# Patient Record
Sex: Female | Born: 1975 | Race: Black or African American | Hispanic: No | Marital: Married | Smoking: Never smoker
Health system: Southern US, Community
[De-identification: ages and names within clinical notes are randomized; demographics above are authoritative.]

## PROBLEM LIST (undated history)

## (undated) DIAGNOSIS — Z789 Other specified health status: Secondary | ICD-10-CM

## (undated) HISTORY — PX: NO PAST SURGERIES: SHX2092

---

## 2004-09-11 ENCOUNTER — Encounter: Admission: RE | Admit: 2004-09-11 | Discharge: 2004-09-11 | Payer: Self-pay | Admitting: Specialist

## 2004-11-03 ENCOUNTER — Emergency Department (HOSPITAL_COMMUNITY): Admission: EM | Admit: 2004-11-03 | Discharge: 2004-11-03 | Payer: Self-pay | Admitting: Family Medicine

## 2004-12-21 ENCOUNTER — Other Ambulatory Visit: Admission: RE | Admit: 2004-12-21 | Discharge: 2004-12-21 | Payer: Self-pay | Admitting: Family Medicine

## 2005-05-12 ENCOUNTER — Emergency Department (HOSPITAL_COMMUNITY): Admission: EM | Admit: 2005-05-12 | Discharge: 2005-05-13 | Payer: Self-pay | Admitting: Emergency Medicine

## 2007-10-11 ENCOUNTER — Emergency Department (HOSPITAL_COMMUNITY): Admission: EM | Admit: 2007-10-11 | Discharge: 2007-10-11 | Payer: Self-pay | Admitting: Emergency Medicine

## 2009-07-18 ENCOUNTER — Encounter: Admission: RE | Admit: 2009-07-18 | Discharge: 2009-07-18 | Payer: Self-pay | Admitting: Family Medicine

## 2011-07-07 IMAGING — CR DG LUMBAR SPINE COMPLETE 4+V
5 series · 5 of 5 positions shown · non-contrast
Comparison: None

CLINICAL DATA: Low back pain radiating into the hips and legs.

LUMBAR SPINE - COMPLETE 4+ VIEW

[view not recorded (1 of 5)]
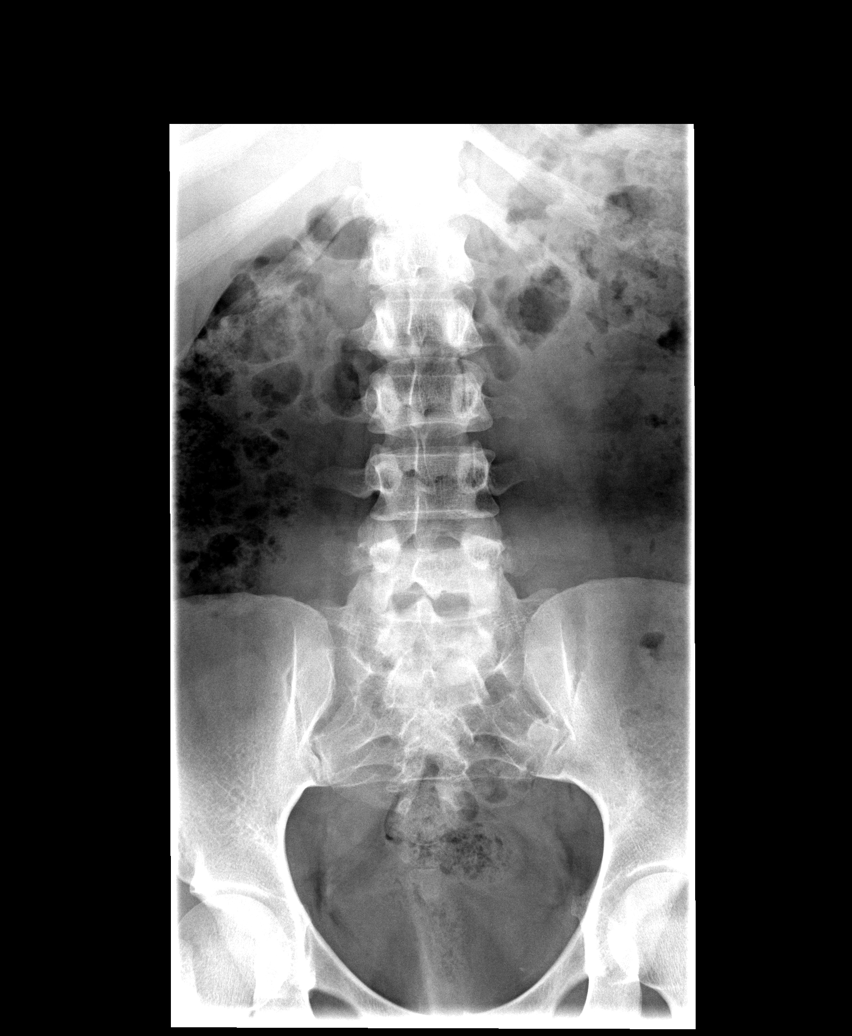

[view not recorded (2 of 5)]
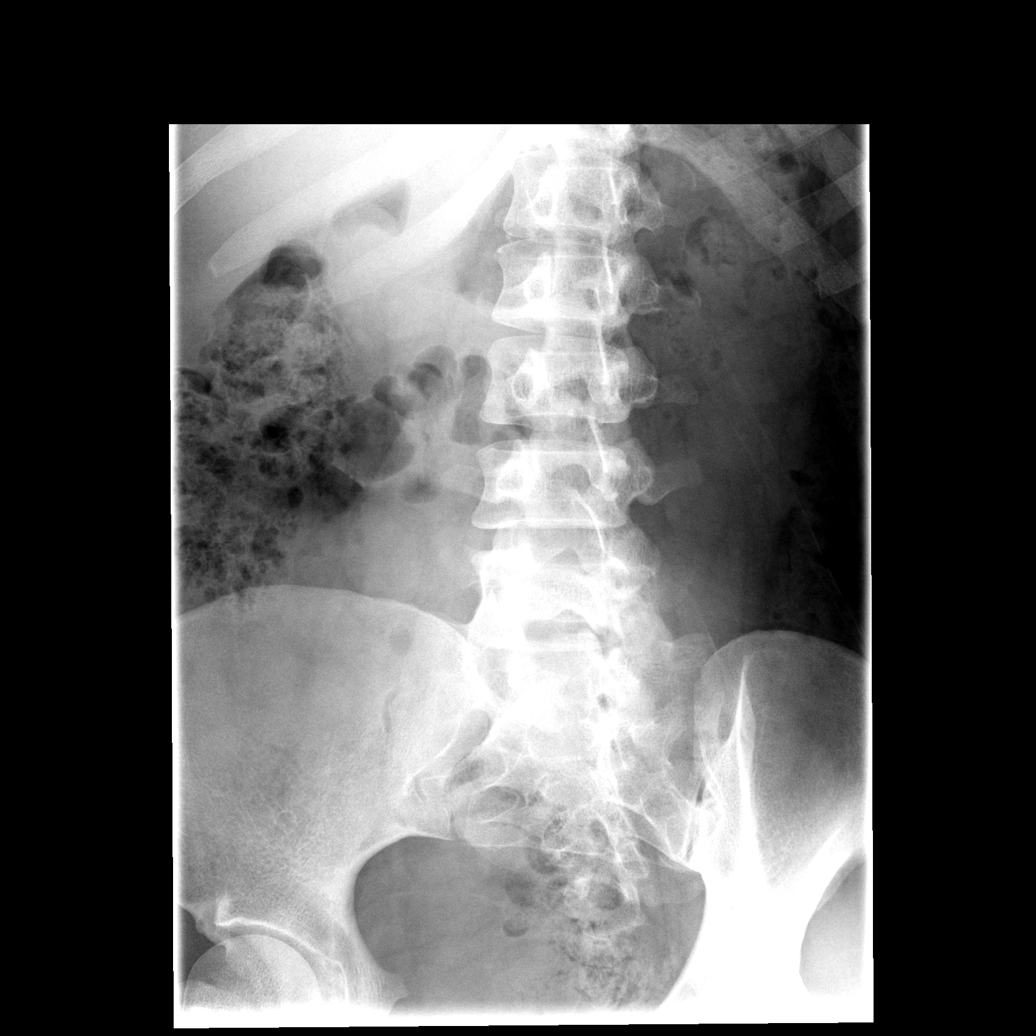

[view not recorded (3 of 5)]
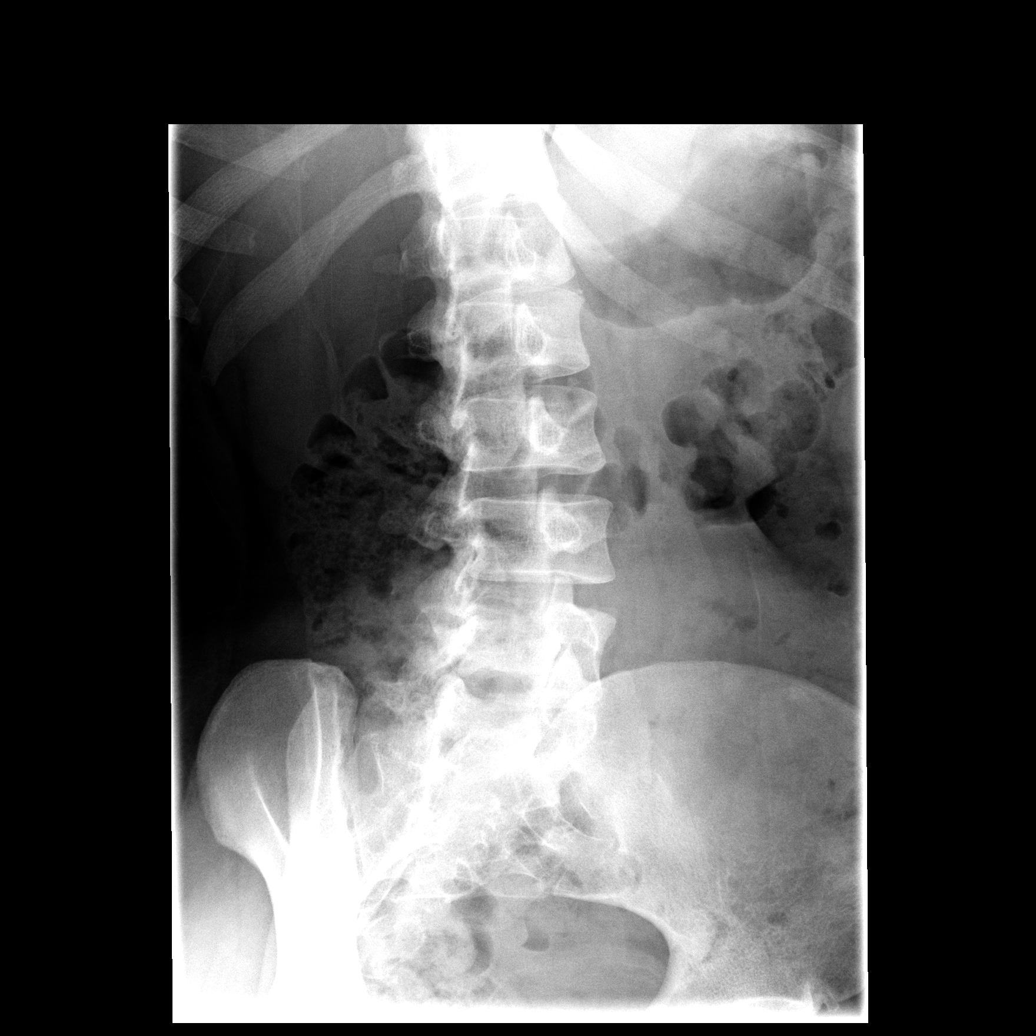

[view not recorded (4 of 5)]
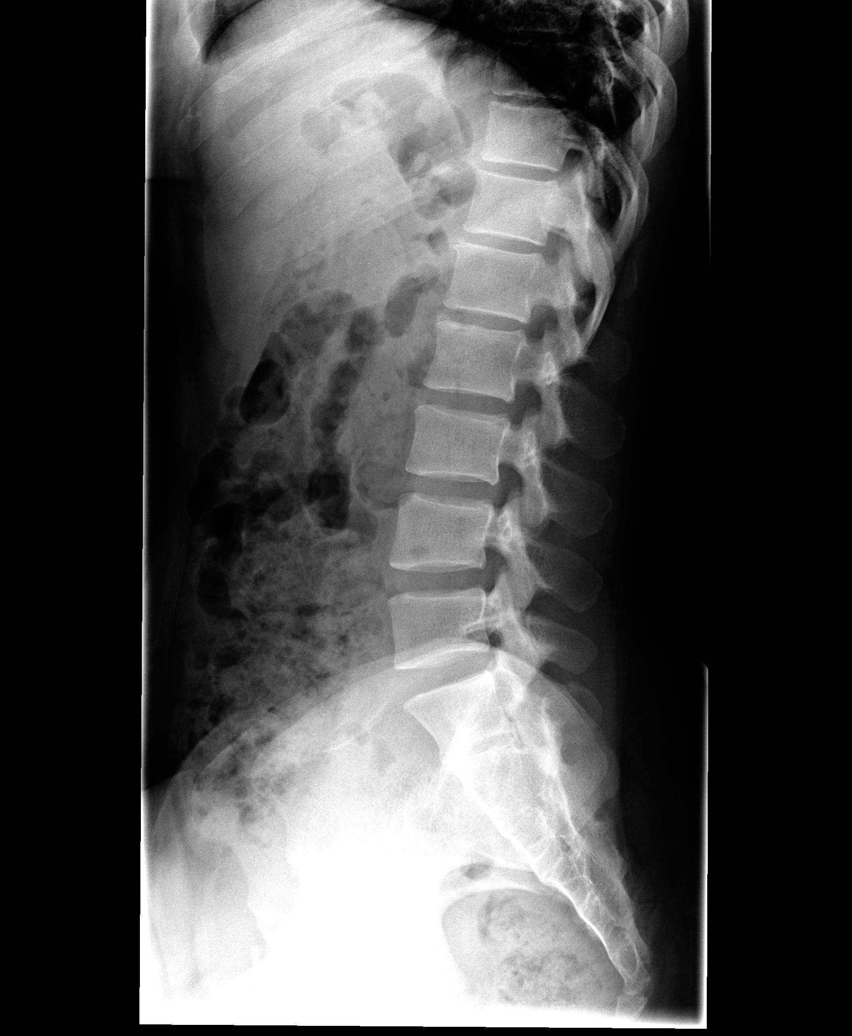

[view not recorded (5 of 5)]
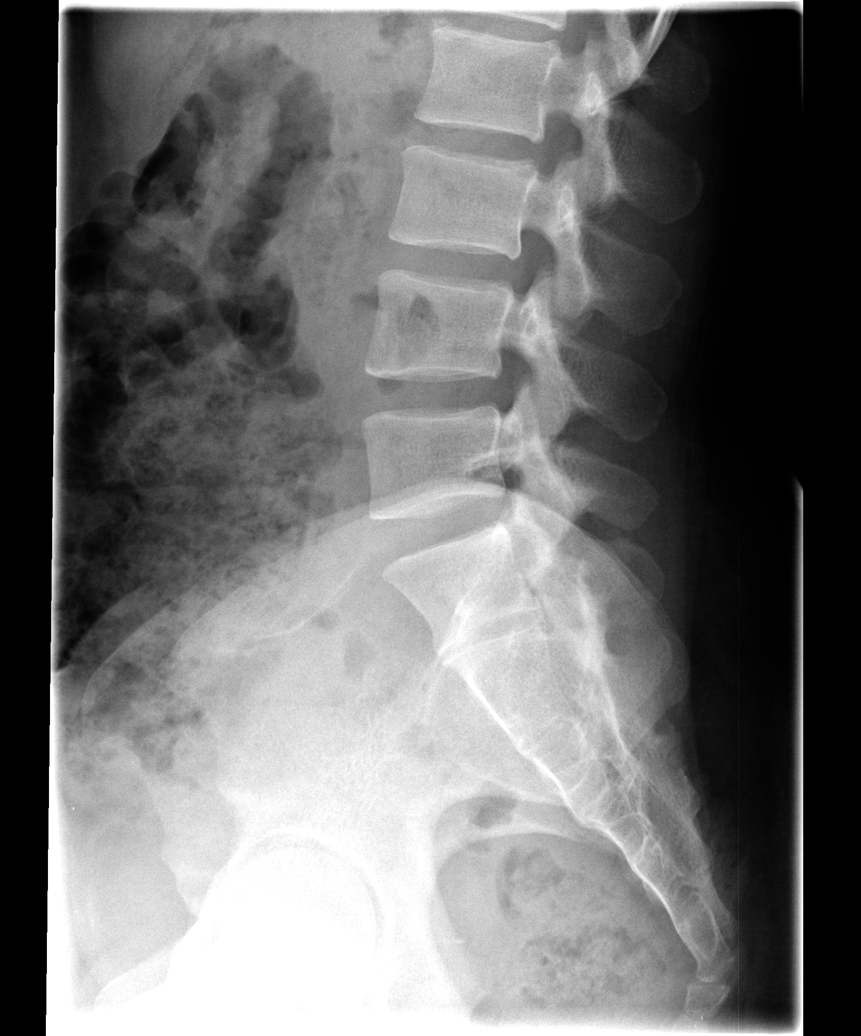

[5 of 5 positions shown; findings below may reference images not displayed]

FINDINGS: There are five typical lumbar segments with no disc space
narrowing, facet arthritis, spondylolisthesis, or other significant
abnormality.
IMPRESSION: Normal lumbar spine.

## 2013-10-08 LAB — OB RESULTS CONSOLE ANTIBODY SCREEN: Antibody Screen: NEGATIVE

## 2013-10-08 LAB — OB RESULTS CONSOLE RPR: RPR: NONREACTIVE

## 2013-10-08 LAB — OB RESULTS CONSOLE ABO/RH: RH Type: POSITIVE

## 2013-10-08 LAB — OB RESULTS CONSOLE GC/CHLAMYDIA
Chlamydia: NEGATIVE
Gonorrhea: NEGATIVE

## 2013-10-08 LAB — OB RESULTS CONSOLE RUBELLA ANTIBODY, IGM: Rubella: IMMUNE

## 2013-10-08 LAB — OB RESULTS CONSOLE HEPATITIS B SURFACE ANTIGEN: Hepatitis B Surface Ag: NEGATIVE

## 2013-10-08 LAB — OB RESULTS CONSOLE HIV ANTIBODY (ROUTINE TESTING): HIV: NONREACTIVE

## 2014-03-24 LAB — OB RESULTS CONSOLE GBS: GBS: POSITIVE

## 2014-04-07 ENCOUNTER — Encounter (HOSPITAL_COMMUNITY): Payer: Self-pay | Admitting: *Deleted

## 2014-04-07 ENCOUNTER — Inpatient Hospital Stay (HOSPITAL_COMMUNITY)
Admission: AD | Admit: 2014-04-07 | Discharge: 2014-04-07 | Disposition: A | Discharge status: Home / Self Care | Payer: 59 | Source: Ambulatory Visit | Attending: Obstetrics and Gynecology | Admitting: Obstetrics and Gynecology

## 2014-04-07 DIAGNOSIS — O09529 Supervision of elderly multigravida, unspecified trimester: Secondary | ICD-10-CM | POA: Diagnosis not present

## 2014-04-07 DIAGNOSIS — O479 False labor, unspecified: Secondary | ICD-10-CM | POA: Diagnosis not present

## 2014-04-07 DIAGNOSIS — O139 Gestational [pregnancy-induced] hypertension without significant proteinuria, unspecified trimester: Secondary | ICD-10-CM

## 2014-04-07 DIAGNOSIS — O133 Gestational [pregnancy-induced] hypertension without significant proteinuria, third trimester: Secondary | ICD-10-CM

## 2014-04-07 LAB — COMPREHENSIVE METABOLIC PANEL
ALT: 16 U/L (ref 0–35)
AST: 22 U/L (ref 0–37)
Albumin: 2.7 g/dL — ABNORMAL LOW (ref 3.5–5.2)
Alkaline Phosphatase: 124 U/L — ABNORMAL HIGH (ref 39–117)
Anion gap: 13 (ref 5–15)
BUN: 9 mg/dL (ref 6–23)
CO2: 21 mEq/L (ref 19–32)
CREATININE: 0.69 mg/dL (ref 0.50–1.10)
Calcium: 9.1 mg/dL (ref 8.4–10.5)
Chloride: 101 mEq/L (ref 96–112)
GFR calc Af Amer: >90 mL/min (ref >90)
GFR calc non Af Amer: >90 mL/min (ref >90)
Glucose, Bld: 86 mg/dL (ref 70–99)
Potassium: 4.2 mEq/L (ref 3.7–5.3)
SODIUM: 135 meq/L — AB (ref 137–147)
Total Bilirubin: <0.2 mg/dL — ABNORMAL LOW (ref 0.3–1.2)
Total Protein: 6.4 g/dL (ref 6.0–8.3)

## 2014-04-07 LAB — CBC
HCT: 41.1 % (ref 36.0–46.0)
Hemoglobin: 14.2 g/dL (ref 12.0–15.0)
MCH: 29.6 pg (ref 26.0–34.0)
MCHC: 34.5 g/dL (ref 30.0–36.0)
MCV: 85.8 fL (ref 78.0–100.0)
PLATELETS: 155 10*3/uL (ref 150–400)
RBC: 4.79 MIL/uL (ref 3.87–5.11)
RDW: 14.8 % (ref 11.5–15.5)
WBC: 10.4 10*3/uL (ref 4.0–10.5)

## 2014-04-07 LAB — URIC ACID: Uric Acid, Serum: 5.5 mg/dL (ref 2.4–7.0)

## 2014-04-07 NOTE — MAU Provider Note (Signed)
History     CSN: [id number]  Arrival date and time: 04/07/14 7510   First Provider Initiated Contact with Patient 04/07/14 0759      No chief complaint on file.  HPI Michaela Calderon is a 38 y.o. G2P0010 at [redacted]w[redacted]d who presents today for labor evaluation. Incidentally she was found to have elevated blood pressure. She denies any headache, blurry vision or epigastric pain. She states that the fetus has been active. She denies any LOF. She has had contractions.   History reviewed. No pertinent past medical history.  History reviewed. No pertinent past surgical history.  History reviewed. No pertinent family history.  History  Substance Use Topics  . Smoking status: Never Smoker   . Smokeless tobacco: Not on file  . Alcohol Use: No    Allergies: No Known Allergies  Prescriptions prior to admission  Medication Sig Dispense Refill  . Multiple Vitamin (MULTIVITAMIN) tablet Take 1 tablet by mouth daily.        ROS Physical Exam   Blood pressure 153/83, pulse 84, temperature 98.4 F (36.9 C), temperature source Oral, resp. rate 20, height 5' (1.524 m), weight 71.271 kg (157 lb 2 oz), unknown if currently breastfeeding.  Physical Exam  Nursing note and vitals reviewed. Constitutional: She is oriented to person, place, and time. She appears well-developed and well-nourished. No distress.  Cardiovascular: Normal rate.   Respiratory: Effort normal.  GI: Soft. There is no tenderness. There is no rebound.  Musculoskeletal: She exhibits no edema.  Neurological: She is alert and oriented to person, place, and time. She has normal reflexes.  No clonus   Skin: Skin is warm and dry.  Psychiatric: She has a normal mood and affect.    FHT 130, moderate with 15x15 accels, no decels Toco: q2-3 min contractions  MAU Course  Procedures  Results for orders placed during the hospital encounter of 04/07/14 (from the past 24 hour(s))  CBC     Status: None   Collection Time    04/07/14  7:25  AM      Result Value Ref Range   WBC 10.4  4.0 - 10.5 K/uL   RBC 4.79  3.87 - 5.11 MIL/uL   Hemoglobin 14.2  12.0 - 15.0 g/dL   HCT 41.1  36.0 - 46.0 %   MCV 85.8  78.0 - 100.0 fL   MCH 29.6  26.0 - 34.0 pg   MCHC 34.5  30.0 - 36.0 g/dL   RDW 14.8  11.5 - 15.5 %   Platelets 155  150 - 400 K/uL  COMPREHENSIVE METABOLIC PANEL     Status: Abnormal   Collection Time    04/07/14  7:25 AM      Result Value Ref Range   Sodium 135 (*) 137 - 147 mEq/L   Potassium 4.2  3.7 - 5.3 mEq/L   Chloride 101  96 - 112 mEq/L   CO2 21  19 - 32 mEq/L   Glucose, Bld 86  70 - 99 mg/dL   BUN 9  6 - 23 mg/dL   Creatinine, Ser 0.69  0.50 - 1.10 mg/dL   Calcium 9.1  8.4 - 10.5 mg/dL   Total Protein 6.4  6.0 - 8.3 g/dL   Albumin 2.7 (*) 3.5 - 5.2 g/dL   AST 22  0 - 37 U/L   ALT 16  0 - 35 U/L   Alkaline Phosphatase 124 (*) 39 - 117 U/L   Total Bilirubin <0.2 (*) 0.3 - 1.2 mg/dL  GFR calc non Af Amer >90  >90 mL/min   GFR calc Af Amer >90  >90 mL/min   Anion gap 13  5 - 15  URIC ACID     Status: None   Collection Time    04/07/14  7:25 AM      Result Value Ref Range   Uric Acid, Serum 5.5  2.4 - 7.0 mg/dL   0827: D/W Dr. Helane Rima, ok for DC home. FU with the office tomorrow.   Assessment and Plan  Transient hypertension in pregnancy Labs normal today Change appointment to tomorrow morning  Pre-eclampsia danger signs reviewed  Fetal kick counts  Labor precautions reviewed   Mathis Bud 04/07/2014, 8:05 AM

## 2014-04-07 NOTE — Discharge Instructions (Signed)

## 2014-04-07 NOTE — MAU Note (Signed)
PT  SAYS SHE HAS BLOOD WITH MUCUS   AND CRAMPING.   LAST IN OFFICE  8-30.   VE-  1 CM.   DENIES HSV AND MRSA.   GBS-  POSITIVE.

## 2014-04-10 ENCOUNTER — Encounter (HOSPITAL_COMMUNITY): Payer: Self-pay | Admitting: *Deleted

## 2014-04-10 ENCOUNTER — Inpatient Hospital Stay (HOSPITAL_COMMUNITY)
Admission: AD | Admit: 2014-04-10 | Discharge: 2014-04-10 | Disposition: A | Discharge status: Home / Self Care | Payer: 59 | Source: Ambulatory Visit | Attending: Obstetrics and Gynecology | Admitting: Obstetrics and Gynecology

## 2014-04-10 DIAGNOSIS — O479 False labor, unspecified: Secondary | ICD-10-CM | POA: Insufficient documentation

## 2014-04-10 MED ORDER — BUTORPHANOL TARTRATE 1 MG/ML IJ SOLN
1.0000 mg | Freq: Once | INTRAMUSCULAR | Status: AC
Start: 2014-04-10 — End: 2014-04-10
  Administered 2014-04-10: 1 mg via INTRAVENOUS
  Filled 2014-04-10: qty 1

## 2014-04-10 NOTE — MAU Note (Signed)
Contractions since 0200 Friday. Some bloody show

## 2014-04-10 NOTE — Discharge Instructions (Signed)
Braxton Hicks Contractions Contractions of the uterus can occur throughout pregnancy. Contractions are not always a sign that you are in labor.  WHAT ARE BRAXTON HICKS CONTRACTIONS?  Contractions that occur before labor are called Braxton Hicks contractions, or false labor. Toward the end of pregnancy (32-34 weeks), these contractions can develop more often and may become more forceful. This is not true labor because these contractions do not result in opening (dilatation) and thinning of the cervix. They are sometimes difficult to tell apart from true labor because these contractions can be forceful and people have different pain tolerances. You should not feel embarrassed if you go to the hospital with false labor. Sometimes, the only way to tell if you are in true labor is for your health care provider to look for changes in the cervix. If there are no prenatal problems or other health problems associated with the pregnancy, it is completely safe to be sent home with false labor and await the onset of true labor. HOW CAN YOU TELL THE DIFFERENCE BETWEEN TRUE AND FALSE LABOR? False Labor  The contractions of false labor are usually shorter and not as hard as those of true labor.   The contractions are usually irregular.   The contractions are often felt in the front of the lower abdomen and in the groin.   The contractions may go away when you walk around or change positions while lying down.   The contractions get weaker and are shorter lasting as time goes on.   The contractions do not usually become progressively stronger, regular, and closer together as with true labor.  True Labor  Contractions in true labor last 30-70 seconds, become very regular, usually become more intense, and increase in frequency.   The contractions do not go away with walking.   The discomfort is usually felt in the top of the uterus and spreads to the lower abdomen and low back.   True labor can be  determined by your health care provider with an exam. This will show that the cervix is dilating and getting thinner.  WHAT TO REMEMBER  Keep up with your usual exercises and follow other instructions given by your health care provider.   Take medicines as directed by your health care provider.   Keep your regular prenatal appointments.   Eat and drink lightly if you think you are going into labor.   If Braxton Hicks contractions are making you uncomfortable:   Change your position from lying down or resting to walking, or from walking to resting.   Sit and rest in a tub of warm water.   Drink 2-3 glasses of water. Dehydration may cause these contractions.   Do slow and deep breathing several times an hour.  WHEN SHOULD I SEEK IMMEDIATE MEDICAL CARE? Seek immediate medical care if:  Your contractions become stronger, more regular, and closer together.   You have fluid leaking or gushing from your vagina.   You have a fever.   You pass blood-tinged mucus.   You have vaginal bleeding.   You have continuous abdominal pain.   You have low back pain that you never had before.   You feel your baby's head pushing down and causing pelvic pressure.   Your baby is not moving as much as it used to.  Document Released: 07/16/2005 Document Revised: 07/21/2013 Document Reviewed: 04/27/2013 [employer] Patient Information 2015 [employer]. This information is not intended to replace advice given to you by your health care   provider. Make sure you discuss any questions you have with your health care provider.  Fetal Movement Counts Patient Name: __________________________________________________ Patient Due Date: ____________________ Performing a fetal movement count is highly recommended in high-risk pregnancies, but it is good for every pregnant woman to do. Your health care provider may ask you to start counting fetal movements at 28 weeks of the pregnancy. Fetal  movements often increase:  After eating a full meal.  After physical activity.  After eating or drinking something sweet or cold.  At rest. Pay attention to when you feel the baby is most active. This will help you notice a pattern of your baby's sleep and wake cycles and what factors contribute to an increase in fetal movement. It is important to perform a fetal movement count at the same time each day when your baby is normally most active.  HOW TO COUNT FETAL MOVEMENTS 1. Find a quiet and comfortable area to sit or lie down on your left side. Lying on your left side provides the best blood and oxygen circulation to your baby. 2. Write down the day and time on a sheet of paper or in a journal. 3. Start counting kicks, flutters, swishes, rolls, or jabs in a 2-hour period. You should feel at least 10 movements within 2 hours. 4. If you do not feel 10 movements in 2 hours, wait 2-3 hours and count again. Look for a change in the pattern or not enough counts in 2 hours. SEEK MEDICAL CARE IF:  You feel less than 10 counts in 2 hours, tried twice.  There is no movement in over an hour.  The pattern is changing or taking longer each day to reach 10 counts in 2 hours.  You feel the baby is not moving as he or she usually does. Date: ____________ Movements: ____________ Start time: ____________ Finish time: ____________  Date: ____________ Movements: ____________ Start time: ____________ Finish time: ____________ Date: ____________ Movements: ____________ Start time: ____________ Finish time: ____________ Date: ____________ Movements: ____________ Start time: ____________ Finish time: ____________ Date: ____________ Movements: ____________ Start time: ____________ Finish time: ____________ Date: ____________ Movements: ____________ Start time: ____________ Finish time: ____________ Date: ____________ Movements: ____________ Start time: ____________ Finish time: ____________ Date: ____________  Movements: ____________ Start time: ____________ Finish time: ____________  Date: ____________ Movements: ____________ Start time: ____________ Finish time: ____________ Date: ____________ Movements: ____________ Start time: ____________ Finish time: ____________ Date: ____________ Movements: ____________ Start time: ____________ Finish time: ____________ Date: ____________ Movements: ____________ Start time: ____________ Finish time: ____________ Date: ____________ Movements: ____________ Start time: ____________ Finish time: ____________ Date: ____________ Movements: ____________ Start time: ____________ Finish time: ____________ Date: ____________ Movements: ____________ Start time: ____________ Finish time: ____________  Date: ____________ Movements: ____________ Start time: ____________ Finish time: ____________ Date: ____________ Movements: ____________ Start time: ____________ Finish time: ____________ Date: ____________ Movements: ____________ Start time: ____________ Finish time: ____________ Date: ____________ Movements: ____________ Start time: ____________ Finish time: ____________ Date: ____________ Movements: ____________ Start time: ____________ Finish time: ____________ Date: ____________ Movements: ____________ Start time: ____________ Finish time: ____________ Date: ____________ Movements: ____________ Start time: ____________ Finish time: ____________  Date: ____________ Movements: ____________ Start time: ____________ Finish time: ____________ Date: ____________ Movements: ____________ Start time: ____________ Finish time: ____________ Date: ____________ Movements: ____________ Start time: ____________ Finish time: ____________ Date: ____________ Movements: ____________ Start time: ____________ Finish time: ____________ Date: ____________ Movements: ____________ Start time: ____________ Finish time: ____________ Date: ____________ Movements: ____________ Start time:  ____________ Finish time: ____________ Date: ____________ Movements:   ____________ Start time: ____________ Finish time: ____________  Date: ____________ Movements: ____________ Start time: ____________ Finish time: ____________ Date: ____________ Movements: ____________ Start time: ____________ Finish time: ____________ Date: ____________ Movements: ____________ Start time: ____________ Finish time: ____________ Date: ____________ Movements: ____________ Start time: ____________ Finish time: ____________ Date: ____________ Movements: ____________ Start time: ____________ Finish time: ____________ Date: ____________ Movements: ____________ Start time: ____________ Finish time: ____________ Date: ____________ Movements: ____________ Start time: ____________ Finish time: ____________  Date: ____________ Movements: ____________ Start time: ____________ Finish time: ____________ Date: ____________ Movements: ____________ Start time: ____________ Finish time: ____________ Date: ____________ Movements: ____________ Start time: ____________ Finish time: ____________ Date: ____________ Movements: ____________ Start time: ____________ Finish time: ____________ Date: ____________ Movements: ____________ Start time: ____________ Finish time: ____________ Date: ____________ Movements: ____________ Start time: ____________ Finish time: ____________ Date: ____________ Movements: ____________ Start time: ____________ Finish time: ____________  Date: ____________ Movements: ____________ Start time: ____________ Finish time: ____________ Date: ____________ Movements: ____________ Start time: ____________ Finish time: ____________ Date: ____________ Movements: ____________ Start time: ____________ Finish time: ____________ Date: ____________ Movements: ____________ Start time: ____________ Finish time: ____________ Date: ____________ Movements: ____________ Start time: ____________ Finish time: ____________ Date:  ____________ Movements: ____________ Start time: ____________ Finish time: ____________ Date: ____________ Movements: ____________ Start time: ____________ Finish time: ____________  Date: ____________ Movements: ____________ Start time: ____________ Finish time: ____________ Date: ____________ Movements: ____________ Start time: ____________ Finish time: ____________ Date: ____________ Movements: ____________ Start time: ____________ Finish time: ____________ Date: ____________ Movements: ____________ Start time: ____________ Finish time: ____________ Date: ____________ Movements: ____________ Start time: ____________ Finish time: ____________ Date: ____________ Movements: ____________ Start time: ____________ Finish time: ____________ Document Released: 08/15/2006 Document Revised: 11/30/2013 Document Reviewed: 05/12/2012 [employer] Patient Information 2015 [employer]. This information is not intended to replace advice given to you by your health care provider. Make sure you discuss any questions you have with your health care provider.  

## 2014-04-12 ENCOUNTER — Inpatient Hospital Stay (HOSPITAL_COMMUNITY): Payer: 59 | Admitting: Certified Registered Nurse Anesthetist

## 2014-04-12 ENCOUNTER — Encounter (HOSPITAL_COMMUNITY)
Admission: AD | Disposition: A | Discharge status: Home / Self Care | Payer: Self-pay | Source: Ambulatory Visit | Attending: Obstetrics and Gynecology

## 2014-04-12 ENCOUNTER — Inpatient Hospital Stay (HOSPITAL_COMMUNITY)
Admission: AD | Admit: 2014-04-12 | Discharge: 2014-04-15 | DRG: 766 | Disposition: A | Discharge status: Home / Self Care | Payer: 59 | Source: Ambulatory Visit | Attending: Obstetrics and Gynecology | Admitting: Obstetrics and Gynecology

## 2014-04-12 ENCOUNTER — Encounter (HOSPITAL_COMMUNITY): Payer: Self-pay

## 2014-04-12 ENCOUNTER — Encounter (HOSPITAL_COMMUNITY): Payer: 59 | Admitting: Certified Registered Nurse Anesthetist

## 2014-04-12 DIAGNOSIS — O09529 Supervision of elderly multigravida, unspecified trimester: Secondary | ICD-10-CM | POA: Diagnosis present

## 2014-04-12 DIAGNOSIS — O9989 Other specified diseases and conditions complicating pregnancy, childbirth and the puerperium: Secondary | ICD-10-CM

## 2014-04-12 DIAGNOSIS — O99892 Other specified diseases and conditions complicating childbirth: Secondary | ICD-10-CM | POA: Diagnosis present

## 2014-04-12 DIAGNOSIS — O3410 Maternal care for benign tumor of corpus uteri, unspecified trimester: Secondary | ICD-10-CM

## 2014-04-12 DIAGNOSIS — D4959 Neoplasm of unspecified behavior of other genitourinary organ: Secondary | ICD-10-CM | POA: Diagnosis present

## 2014-04-12 DIAGNOSIS — O479 False labor, unspecified: Secondary | ICD-10-CM | POA: Diagnosis present

## 2014-04-12 DIAGNOSIS — D252 Subserosal leiomyoma of uterus: Secondary | ICD-10-CM | POA: Diagnosis present

## 2014-04-12 DIAGNOSIS — Z22330 Carrier of Group B streptococcus: Secondary | ICD-10-CM | POA: Diagnosis not present

## 2014-04-12 DIAGNOSIS — O368390 Maternal care for abnormalities of the fetal heart rate or rhythm, unspecified trimester, not applicable or unspecified: Secondary | ICD-10-CM | POA: Diagnosis present

## 2014-04-12 DIAGNOSIS — O34599 Maternal care for other abnormalities of gravid uterus, unspecified trimester: Secondary | ICD-10-CM | POA: Diagnosis present

## 2014-04-12 HISTORY — DX: Other specified health status: Z78.9

## 2014-04-12 LAB — TYPE AND SCREEN
ABO/RH(D): A POS
Antibody Screen: NEGATIVE

## 2014-04-12 LAB — CBC
HCT: 40.8 % (ref 36.0–46.0)
Hemoglobin: 14.3 g/dL (ref 12.0–15.0)
MCH: 29.9 pg (ref 26.0–34.0)
MCHC: 35 g/dL (ref 30.0–36.0)
MCV: 85.4 fL (ref 78.0–100.0)
Platelets: 155 10*3/uL (ref 150–400)
RBC: 4.78 MIL/uL (ref 3.87–5.11)
RDW: 14.6 % (ref 11.5–15.5)
WBC: 13.6 10*3/uL — ABNORMAL HIGH (ref 4.0–10.5)

## 2014-04-12 SURGERY — Surgical Case
Anesthesia: Spinal | Site: Abdomen

## 2014-04-12 MED ORDER — DIPHENHYDRAMINE HCL 25 MG PO CAPS: 25.0000 mg | ORAL_CAPSULE | ORAL | Status: DC | PRN

## 2014-04-12 MED ORDER — NALOXONE HCL 1 MG/ML IJ SOLN
1.0000 ug/kg/h | INTRAVENOUS | Status: DC | PRN
  Filled 2014-04-12: qty 2

## 2014-04-12 MED ORDER — OXYCODONE-ACETAMINOPHEN 5-325 MG PO TABS
2.0000 | ORAL_TABLET | ORAL | Status: DC | PRN
  Administered 2014-04-13 – 2014-04-14 (×2): 2 via ORAL
  Filled 2014-04-12: qty 2

## 2014-04-12 MED ORDER — OXYTOCIN 40 UNITS IN LACTATED RINGERS INFUSION - SIMPLE MED: 62.5000 mL/h | INTRAVENOUS | Status: AC

## 2014-04-12 MED ORDER — ZOLPIDEM TARTRATE 5 MG PO TABS
5.0000 mg | ORAL_TABLET | Freq: Every evening | ORAL | Status: DC | PRN
Start: 2014-04-12 — End: 2014-04-15

## 2014-04-12 MED ORDER — NALOXONE HCL 0.4 MG/ML IJ SOLN: 0.4000 mg | INTRAMUSCULAR | Status: DC | PRN

## 2014-04-12 MED ORDER — PRENATAL MULTIVITAMIN CH
1.0000 | ORAL_TABLET | Freq: Every day | ORAL | Status: DC
  Administered 2014-04-13 – 2014-04-15 (×3): 1 via ORAL
  Filled 2014-04-12 (×3): qty 1

## 2014-04-12 MED ORDER — FENTANYL CITRATE 0.05 MG/ML IJ SOLN: 25.0000 ug | INTRAMUSCULAR | Status: DC | PRN

## 2014-04-12 MED ORDER — KETOROLAC TROMETHAMINE 30 MG/ML IJ SOLN
30.0000 mg | Freq: Four times a day (QID) | INTRAMUSCULAR | Status: AC | PRN
  Administered 2014-04-12: 30 mg via INTRAVENOUS

## 2014-04-12 MED ORDER — DIBUCAINE 1 % RE OINT: 1.0000 "application " | TOPICAL_OINTMENT | RECTAL | Status: DC | PRN

## 2014-04-12 MED ORDER — MEPERIDINE HCL 25 MG/ML IJ SOLN
INTRAMUSCULAR | Status: AC
  Filled 2014-04-12: qty 1

## 2014-04-12 MED ORDER — SODIUM CHLORIDE 0.9 % IJ SOLN
3.0000 mL | INTRAMUSCULAR | Status: DC | PRN
Start: 2014-04-12 — End: 2014-04-15

## 2014-04-12 MED ORDER — 0.9 % SODIUM CHLORIDE (POUR BTL) OPTIME
TOPICAL | Status: DC | PRN
  Administered 2014-04-12: 1000 mL

## 2014-04-12 MED ORDER — SCOPOLAMINE 1 MG/3DAYS TD PT72
MEDICATED_PATCH | TRANSDERMAL | Status: AC
  Filled 2014-04-12: qty 1

## 2014-04-12 MED ORDER — SIMETHICONE 80 MG PO CHEW
80.0000 mg | CHEWABLE_TABLET | Freq: Three times a day (TID) | ORAL | Status: DC
  Administered 2014-04-13 – 2014-04-15 (×7): 80 mg via ORAL
  Filled 2014-04-12 (×6): qty 1

## 2014-04-12 MED ORDER — FENTANYL CITRATE 0.05 MG/ML IJ SOLN
INTRAMUSCULAR | Status: AC
  Filled 2014-04-12: qty 2

## 2014-04-12 MED ORDER — ONDANSETRON HCL 4 MG PO TABS: 4.0000 mg | ORAL_TABLET | ORAL | Status: DC | PRN

## 2014-04-12 MED ORDER — MORPHINE SULFATE (PF) 0.5 MG/ML IJ SOLN
INTRAMUSCULAR | Status: DC | PRN
  Administered 2014-04-12: .2 mg via INTRATHECAL

## 2014-04-12 MED ORDER — TETANUS-DIPHTH-ACELL PERTUSSIS 5-2.5-18.5 LF-MCG/0.5 IM SUSP: 0.5000 mL | Freq: Once | INTRAMUSCULAR | Status: DC

## 2014-04-12 MED ORDER — ONDANSETRON HCL 4 MG/2ML IJ SOLN: 4.0000 mg | INTRAMUSCULAR | Status: DC | PRN

## 2014-04-12 MED ORDER — PHENYLEPHRINE 8 MG IN D5W 100 ML (0.08MG/ML) PREMIX OPTIME
INJECTION | INTRAVENOUS | Status: DC | PRN
  Administered 2014-04-12: 60 ug/min via INTRAVENOUS

## 2014-04-12 MED ORDER — MEPERIDINE HCL 25 MG/ML IJ SOLN
6.2500 mg | INTRAMUSCULAR | Status: AC | PRN
  Administered 2014-04-12 (×2): 6.25 mg via INTRAVENOUS

## 2014-04-12 MED ORDER — OXYTOCIN 10 UNIT/ML IJ SOLN
INTRAMUSCULAR | Status: AC
  Filled 2014-04-12: qty 4

## 2014-04-12 MED ORDER — CEFAZOLIN SODIUM-DEXTROSE 2-3 GM-% IV SOLR
INTRAVENOUS | Status: DC | PRN
  Administered 2014-04-12: 2 g via INTRAVENOUS

## 2014-04-12 MED ORDER — LACTATED RINGERS IV SOLN
INTRAVENOUS | Status: DC
  Administered 2014-04-12: 20:00:00 via INTRAVENOUS

## 2014-04-12 MED ORDER — MENTHOL 3 MG MT LOZG: 1.0000 | LOZENGE | OROMUCOSAL | Status: DC | PRN

## 2014-04-12 MED ORDER — MORPHINE SULFATE 0.5 MG/ML IJ SOLN
INTRAMUSCULAR | Status: AC
  Filled 2014-04-12: qty 10

## 2014-04-12 MED ORDER — DIPHENHYDRAMINE HCL 50 MG/ML IJ SOLN: 12.5000 mg | INTRAMUSCULAR | Status: DC | PRN

## 2014-04-12 MED ORDER — OXYTOCIN 10 UNIT/ML IJ SOLN
40.0000 [IU] | INTRAMUSCULAR | Status: DC | PRN
  Administered 2014-04-12: 40 [IU] via INTRAVENOUS

## 2014-04-12 MED ORDER — METOCLOPRAMIDE HCL 5 MG/ML IJ SOLN: 10.0000 mg | Freq: Three times a day (TID) | INTRAMUSCULAR | Status: DC | PRN

## 2014-04-12 MED ORDER — ONDANSETRON HCL 4 MG/2ML IJ SOLN
INTRAMUSCULAR | Status: AC
  Filled 2014-04-12: qty 2

## 2014-04-12 MED ORDER — OXYCODONE-ACETAMINOPHEN 5-325 MG PO TABS
1.0000 | ORAL_TABLET | ORAL | Status: DC | PRN
  Administered 2014-04-13 – 2014-04-15 (×4): 1 via ORAL
  Filled 2014-04-12 (×5): qty 1

## 2014-04-12 MED ORDER — KETOROLAC TROMETHAMINE 30 MG/ML IJ SOLN: 30.0000 mg | Freq: Four times a day (QID) | INTRAMUSCULAR | Status: AC | PRN

## 2014-04-12 MED ORDER — LACTATED RINGERS IV SOLN
INTRAVENOUS | Status: DC
  Administered 2014-04-13: 07:00:00 via INTRAVENOUS

## 2014-04-12 MED ORDER — ONDANSETRON HCL 4 MG/2ML IJ SOLN
INTRAMUSCULAR | Status: DC | PRN
Start: 2014-04-12 — End: 2014-04-12
  Administered 2014-04-12: 4 mg via INTRAVENOUS

## 2014-04-12 MED ORDER — DIPHENHYDRAMINE HCL 25 MG PO CAPS
25.0000 mg | ORAL_CAPSULE | Freq: Four times a day (QID) | ORAL | Status: DC | PRN
Start: 2014-04-12 — End: 2014-04-15

## 2014-04-12 MED ORDER — ONDANSETRON HCL 4 MG/2ML IJ SOLN: 4.0000 mg | Freq: Three times a day (TID) | INTRAMUSCULAR | Status: DC | PRN

## 2014-04-12 MED ORDER — WITCH HAZEL-GLYCERIN EX PADS: 1.0000 "application " | MEDICATED_PAD | CUTANEOUS | Status: DC | PRN

## 2014-04-12 MED ORDER — BUPIVACAINE LIPOSOME 1.3 % IJ SUSP
20.0000 mL | Freq: Once | INTRAMUSCULAR | Status: DC
  Filled 2014-04-12: qty 20

## 2014-04-12 MED ORDER — SCOPOLAMINE 1 MG/3DAYS TD PT72
1.0000 | MEDICATED_PATCH | Freq: Once | TRANSDERMAL | Status: DC
  Administered 2014-04-12: 1.5 mg via TRANSDERMAL

## 2014-04-12 MED ORDER — SODIUM CHLORIDE 0.9 % IJ SOLN
INTRAMUSCULAR | Status: AC
Start: 2014-04-12 — End: 2014-04-12
  Filled 2014-04-12: qty 20

## 2014-04-12 MED ORDER — BUPIVACAINE LIPOSOME 1.3 % IJ SUSP
INTRAMUSCULAR | Status: DC | PRN
  Administered 2014-04-12: 20 mL

## 2014-04-12 MED ORDER — SIMETHICONE 80 MG PO CHEW: 80.0000 mg | CHEWABLE_TABLET | ORAL | Status: DC | PRN

## 2014-04-12 MED ORDER — LACTATED RINGERS IV SOLN
INTRAVENOUS | Status: DC | PRN
  Administered 2014-04-12: 21:00:00 via INTRAVENOUS
  Administered 2014-04-12: 1000 mL
  Administered 2014-04-12: 20:00:00 via INTRAVENOUS

## 2014-04-12 MED ORDER — SENNOSIDES-DOCUSATE SODIUM 8.6-50 MG PO TABS
2.0000 | ORAL_TABLET | ORAL | Status: DC
  Administered 2014-04-14 (×2): 2 via ORAL
  Filled 2014-04-12 (×2): qty 2

## 2014-04-12 MED ORDER — CITRIC ACID-SODIUM CITRATE 334-500 MG/5ML PO SOLN
30.0000 mL | Freq: Once | ORAL | Status: AC
  Administered 2014-04-12: 30 mL via ORAL

## 2014-04-12 MED ORDER — SIMETHICONE 80 MG PO CHEW
80.0000 mg | CHEWABLE_TABLET | ORAL | Status: DC
  Administered 2014-04-14 (×2): 80 mg via ORAL
  Filled 2014-04-12 (×2): qty 1

## 2014-04-12 MED ORDER — KETOROLAC TROMETHAMINE 30 MG/ML IJ SOLN
INTRAMUSCULAR | Status: AC
  Filled 2014-04-12: qty 1

## 2014-04-12 MED ORDER — PHENYLEPHRINE 8 MG IN D5W 100 ML (0.08MG/ML) PREMIX OPTIME
INJECTION | INTRAVENOUS | Status: AC
  Filled 2014-04-12: qty 100

## 2014-04-12 MED ORDER — NALBUPHINE HCL 10 MG/ML IJ SOLN: 5.0000 mg | INTRAMUSCULAR | Status: DC | PRN

## 2014-04-12 MED ORDER — IBUPROFEN 600 MG PO TABS
600.0000 mg | ORAL_TABLET | Freq: Four times a day (QID) | ORAL | Status: DC
  Administered 2014-04-13 – 2014-04-15 (×10): 600 mg via ORAL
  Filled 2014-04-12 (×10): qty 1

## 2014-04-12 MED ORDER — BUPIVACAINE IN DEXTROSE 0.75-8.25 % IT SOLN
INTRATHECAL | Status: DC | PRN
  Administered 2014-04-12: 1.6 mL via INTRATHECAL

## 2014-04-12 MED ORDER — DIPHENHYDRAMINE HCL 50 MG/ML IJ SOLN: 25.0000 mg | INTRAMUSCULAR | Status: DC | PRN

## 2014-04-12 MED ORDER — LANOLIN HYDROUS EX OINT: 1.0000 "application " | TOPICAL_OINTMENT | CUTANEOUS | Status: DC | PRN

## 2014-04-12 SURGICAL SUPPLY — 38 items
ADH SKN CLS APL DERMABOND .7 (GAUZE/BANDAGES/DRESSINGS)
APL SKNCLS STERI-STRIP NONHPOA (GAUZE/BANDAGES/DRESSINGS)
BENZOIN TINCTURE PRP APPL 2/3 (GAUZE/BANDAGES/DRESSINGS) IMPLANT
BLADE SURG 10 STRL SS (BLADE) ×6 IMPLANT
CLAMP CORD UMBIL (MISCELLANEOUS) IMPLANT
CLOSURE WOUND 1/2 X4 (GAUZE/BANDAGES/DRESSINGS)
CLOTH BEACON ORANGE TIMEOUT ST (SAFETY) ×3 IMPLANT
CONTAINER PREFILL 10% NBF 15ML (MISCELLANEOUS) IMPLANT
DERMABOND ADVANCED (GAUZE/BANDAGES/DRESSINGS)
DERMABOND ADVANCED .7 DNX12 (GAUZE/BANDAGES/DRESSINGS) IMPLANT
DRAPE LG THREE QUARTER DISP (DRAPES) IMPLANT
DRSG OPSITE POSTOP 4X10 (GAUZE/BANDAGES/DRESSINGS) ×3 IMPLANT
DURAPREP 26ML APPLICATOR (WOUND CARE) ×3 IMPLANT
ELECT REM PT RETURN 9FT ADLT (ELECTROSURGICAL) ×3
ELECTRODE REM PT RTRN 9FT ADLT (ELECTROSURGICAL) ×1 IMPLANT
EXTRACTOR VACUUM M CUP 4 TUBE (SUCTIONS) IMPLANT
EXTRACTOR VACUUM M CUP 4' TUBE (SUCTIONS)
GLOVE BIO SURGEON STRL SZ7.5 (GLOVE) ×3 IMPLANT
GOWN STRL REUS W/TWL LRG LVL3 (GOWN DISPOSABLE) ×6 IMPLANT
KIT ABG SYR 3ML LUER SLIP (SYRINGE) ×3 IMPLANT
NDL HYPO 21X1.5 SAFETY (NEEDLE) ×1 IMPLANT
NDL HYPO 25X5/8 SAFETYGLIDE (NEEDLE) ×1 IMPLANT
NEEDLE HYPO 21X1.5 SAFETY (NEEDLE) ×3 IMPLANT
NEEDLE HYPO 25X5/8 SAFETYGLIDE (NEEDLE) ×3 IMPLANT
NS IRRIG 1000ML POUR BTL (IV SOLUTION) ×3 IMPLANT
PACK C SECTION WH (CUSTOM PROCEDURE TRAY) ×3 IMPLANT
PAD OB MATERNITY 4.3X12.25 (PERSONAL CARE ITEMS) ×3 IMPLANT
STAPLER VISISTAT 35W (STAPLE) IMPLANT
STRIP CLOSURE SKIN 1/2X4 (GAUZE/BANDAGES/DRESSINGS) IMPLANT
SUT MNCRL 0 VIOLET CTX 36 (SUTURE) ×4 IMPLANT
SUT MONOCRYL 0 CTX 36 (SUTURE) ×8
SUT PDS AB 0 CTX 60 (SUTURE) ×3 IMPLANT
SUT PLAIN 0 NONE (SUTURE) IMPLANT
SUT VIC AB 4-0 KS 27 (SUTURE) ×3 IMPLANT
SYRINGE 20CC LL (MISCELLANEOUS) ×3 IMPLANT
TOWEL OR 17X24 6PK STRL BLUE (TOWEL DISPOSABLE) ×3 IMPLANT
TRAY FOLEY CATH 14FR (SET/KITS/TRAYS/PACK) ×3 IMPLANT
WATER STERILE IRR 1000ML POUR (IV SOLUTION) ×3 IMPLANT

## 2014-04-12 NOTE — Anesthesia Postprocedure Evaluation (Signed)
  Anesthesia Post-op Note  Patient: Michaela Calderon  Procedure(s) Performed: Procedure(s): CESAREAN SECTION (N/A)  Patient Location: PACU  Anesthesia Type:Spinal  Level of Consciousness: awake and alert   Airway and Oxygen Therapy: Patient Spontanous Breathing  Post-op Pain: none  Post-op Assessment: Post-op Vital signs reviewed, Patient's Cardiovascular Status Stable, Respiratory Function Stable, Patent Airway, No signs of Nausea or vomiting and Pain level controlled  Post-op Vital Signs: Reviewed and stable  Last Vitals:  Filed Vitals:   04/12/14 2215  BP: 145/76  Pulse: 66  Temp:   Resp: 39    Complications: No apparent anesthesia complications

## 2014-04-12 NOTE — H&P (Signed)
Avalie Larch is a 38 y.o. female presenting for C/O UCs for about 1 week now getting worse. Has had some bloody show, no ROM. No HA, no vision change, no epigastric pain. Maternal Medical History:  Reason for admission: Contractions.   Contractions: Onset was 1 week ago.    Fetal activity: Perceived fetal activity is normal.      OB History   Grav Para Term Preterm Abortions TAB SAB Ect Mult Living   2    1  1         No past medical history on file. No past surgical history on file. Family History: family history is not on file. Social History:  reports that she has never smoked. She does not have any smokeless tobacco history on file. She reports that she does not drink alcohol or use illicit drugs.   Prenatal Transfer Tool  Maternal Diabetes: No Genetic Screening: Normal Maternal Ultrasounds/Referrals: Normal Fetal Ultrasounds or other Referrals:  None Maternal Substance Abuse:  No Significant Maternal Medications:  None Significant Maternal Lab Results:  None Other Comments:  None  Review of Systems  Eyes: Negative for blurred vision.  Gastrointestinal: Negative for abdominal pain.  Neurological: Negative for headaches.      Blood pressure 136/71, pulse 79, temperature 98.6 F (37 C), temperature source Oral, resp. rate 18, height 5\' 2"  (1.575 m), weight 69.854 kg (154 lb). Maternal Exam:  Uterine Assessment: Contraction strength is firm.  Contraction frequency is irregular.   Abdomen: Fetal presentation: vertex     Fetal Exam Fetal Monitor Review: Pattern: variable decelerations.       Physical Exam  Cardiovascular: Normal rate and regular rhythm.   Respiratory: Effort normal and breath sounds normal.  GI: Soft. There is no tenderness.  Neurological: She has normal reflexes.   Cx 4/C/-2/vtx Prenatal labs: ABO, Rh: A/Positive/-- (03/12 0000) Antibody: Negative (03/12 0000) Rubella: Immune (03/12 0000) RPR: Nonreactive (03/12 0000)  HBsAg: Negative  (03/12 0000)  HIV: Non-reactive (03/12 0000)  GBS: Positive (08/26 0000)   Assessment/Plan: 38 yo G2P0 at 59 6/7 weeks with repetitive deep variable decels Rec: cesarean section-D/W patient and risks including infection, organ damage, bleeding/transfusion-HIV/Hep, DVT/PE, pneumonia.  All questions answered   Tracy Gerken II,Monai Hindes E 04/12/2014, 7:37 PM

## 2014-04-12 NOTE — Anesthesia Preprocedure Evaluation (Signed)
Anesthesia Evaluation  Patient identified by MRN, date of birth, ID band Patient awake    Reviewed: Allergy & Precautions, H&P , NPO status , Patient's Chart, lab work & pertinent test results  History of Anesthesia Complications Negative for: history of anesthetic complications  Airway Mallampati: II TM Distance: >3 FB Neck ROM: Full    Dental  (+) Teeth Intact   Pulmonary neg pulmonary ROS,          Cardiovascular negative cardio ROS  Rhythm:Regular     Neuro/Psych negative neurological ROS     GI/Hepatic negative GI ROS, Neg liver ROS,   Endo/Other  negative endocrine ROS  Renal/GU negative Renal ROS     Musculoskeletal   Abdominal   Peds  Hematology negative hematology ROS (+)   Anesthesia Other Findings   Reproductive/Obstetrics (+) Pregnancy                           Anesthesia Physical Anesthesia Plan  ASA: II and emergent  Anesthesia Plan: Spinal   Post-op Pain Management:    Induction:   Airway Management Planned:   Additional Equipment: None  Intra-op Plan:   Post-operative Plan:   Informed Consent: I have reviewed the patients History and Physical, chart, labs and discussed the procedure including the risks, benefits and alternatives for the proposed anesthesia with the patient or authorized representative who has indicated his/her understanding and acceptance.     Plan Discussed with: CRNA and Surgeon  Anesthesia Plan Comments:         Anesthesia Quick Evaluation

## 2014-04-12 NOTE — Transfer of Care (Signed)
Immediate Anesthesia Transfer of Care Note  Patient: Michaela Calderon  Procedure(s) Performed: Procedure(s): CESAREAN SECTION (N/A)  Patient Location: PACU  Anesthesia Type:Spinal  Level of Consciousness: awake, alert  and oriented  Airway & Oxygen Therapy: Patient Spontanous Breathing  Post-op Assessment: Report given to PACU RN and Post -op Vital signs reviewed and stable  Post vital signs: Reviewed and stable  Complications: No apparent anesthesia complications

## 2014-04-12 NOTE — Anesthesia Procedure Notes (Signed)
Spinal  Patient location during procedure: OR Start time: 04/12/2014 2:04 AM End time: 04/12/2014 2:07 AM Staffing Anesthesiologist: Ermalene Postin, CHRIS Preanesthetic Checklist Completed: patient identified, surgical consent, pre-op evaluation, timeout performed, IV checked, risks and benefits discussed and monitors and equipment checked Spinal Block Patient position: sitting Prep: site prepped and draped and DuraPrep Patient monitoring: heart rate, cardiac monitor, continuous pulse ox and blood pressure Approach: midline Location: L4-5 Injection technique: single-shot Needle Needle type: Pencan  Needle gauge: 24 G Needle length: 10 cm Assessment Sensory level: T4

## 2014-04-12 NOTE — MAU Note (Signed)
Contractions all week, worse s1nce last night. Having some bleeding. Was examined in MAU couple of days ago. Was 2cm.

## 2014-04-12 NOTE — Brief Op Note (Signed)
04/12/2014  8:51 PM  PATIENT:  Michaela Calderon  38 y.o. female  PRE-OPERATIVE DIAGNOSIS:  Intolerence to labor  POST-OPERATIVE DIAGNOSIS:  Intolerence to labor  PROCEDURE:  Procedure(s): CESAREAN SECTION (N/A), biopsy of uterus  SURGEON:  Surgeon(s) and Role:    * Shon Millet II, MD - Primary  PHYSICIAN ASSISTANT:   ASSISTANTS: none   ANESTHESIA:   spinal  EBL:  Total I/O In: 1100 [I.V.:1100] Out: 700 [Urine:200; Blood:500]  BLOOD ADMINISTERED:none  DRAINS: Urinary Catheter (Foley)   LOCAL MEDICATIONS USED:  Amount: 20 ml in 20 ml of saline and OTHER Exparel  SPECIMEN:  Source of Specimen:  placenta, biopsy of uterus  DISPOSITION OF SPECIMEN:  PATHOLOGY  COUNTS:  YES  TOURNIQUET:  * No tourniquets in log *  DICTATION: .Other Dictation: Dictation Number [id number]  PLAN OF CARE: Admit to inpatient   PATIENT DISPOSITION:  PACU - hemodynamically stable.   Delay start of Pharmacological VTE agent (>24hrs) due to surgical blood loss or risk of bleeding: not applicable

## 2014-04-13 ENCOUNTER — Encounter (HOSPITAL_COMMUNITY): Payer: Self-pay | Admitting: Obstetrics and Gynecology

## 2014-04-13 LAB — CBC
HEMATOCRIT: 36.4 % (ref 36.0–46.0)
Hemoglobin: 12.4 g/dL (ref 12.0–15.0)
MCH: 29.2 pg (ref 26.0–34.0)
MCHC: 34.1 g/dL (ref 30.0–36.0)
MCV: 85.8 fL (ref 78.0–100.0)
Platelets: 136 10*3/uL — ABNORMAL LOW (ref 150–400)
RBC: 4.24 MIL/uL (ref 3.87–5.11)
RDW: 14.7 % (ref 11.5–15.5)
WBC: 12 10*3/uL — ABNORMAL HIGH (ref 4.0–10.5)

## 2014-04-13 LAB — RPR

## 2014-04-13 LAB — ABO/RH: ABO/RH(D): A POS

## 2014-04-13 MED ORDER — INFLUENZA VAC SPLIT QUAD 0.5 ML IM SUSY
0.5000 mL | PREFILLED_SYRINGE | INTRAMUSCULAR | Status: AC
  Administered 2014-04-15: 0.5 mL via INTRAMUSCULAR
  Filled 2014-04-13 (×2): qty 0.5

## 2014-04-13 NOTE — Op Note (Signed)
Michaela Calderon, WAH NO.:  [id number]  MEDICAL RECORD NO.:  [id number]  LOCATION:  9102                          FACILITY:  [facility]  PHYSICIAN:  Daleen Bo. Gaetano Net, M.D. DATE OF BIRTH:  10/03/1975  DATE OF PROCEDURE:  04/12/2014 DATE OF DISCHARGE:                              OPERATIVE REPORT   PREOPERATIVE DIAGNOSIS:  Fetal intolerance of labor.  POSTOPERATIVE DIAGNOSIS:  Fetal intolerance of labor.  PROCEDURE: 1. Primary low-transverse cesarean section, urgent. 2. Biopsy of uterus.  SURGEON:  Daleen Bo. Gaetano Net, M.D.  ANESTHESIA:  Spinal, Dr. Ermalene Postin.  ESTIMATED BLOOD LOSS:  350 mL.  SPECIMENS: 1. Placenta. 2. Biopsy of uterus both to Pathology.  FINDINGS:  Viable female infant, Apgars and weight pending at the time of dictation.  Arterial cord pH 7.11.  INDICATIONS AND CONSENT:  This patient is a 38 year old married African American female, G2, P0, at 38-6/7 weeks.  She presents to [facility] with complaints of contractions for about 1 week.  She has had vaginal spotting for about 1 week.  No gush of fluid, no headache, no vision changes, no epigastric pain.  During evaluation in the MAU, she was noted to have a cervix that was 4 cm dilated, completely effaced, -2 station.  Fetal heart tones were approximately 150s with variable decelerations to the 40s and 50s with every 3rd or 4th contraction.  She was given IV fluids and oxygen and position change and this failed to resolve.  Cesarean section was immediately recommended to the patient. Potential risks and complications were reviewed preoperatively including, but not limited to, infection, organ damage, bleeding requiring transfusion of blood products with HIV and hepatitis acquisition, DVT, PE, pneumonia.  All questions were answered and consent was signed on the chart.  DESCRIPTION OF PROCEDURE:  The patient was taken to the operating room in an urgent fashion.  She had a spinal anesthetic  placed by Dr. Ermalene Postin. She was placed in the dorsal supine position with a 15-degree left lateral wedge.  She was prepped.  Foley catheter was placed, the bladder was drained, and she was draped in a sterile fashion.  Time-out was undertaken.  After testing for adequate spinal anesthesia, skin was entered through a Pfannenstiel incision and dissection was carried out in layers to the peritoneum.  Peritoneum was incised and extended bluntly.  Vesicouterine peritoneum was taken down cephalolaterally.  The bladder flap was developed and the bladder blade was placed.  Uterus was incised in a low transverse manner and the uterine cavity was entered bluntly.  The uterine incision was extended cephalolaterally with fingers.  Artificial rupture of the membranes for thick meconium was done.  Baby was delivered from the vertex position without difficulty. Cord around the right shoulder was noted.  Cord was clamped and cut and the baby was handed to the awaiting pediatrics team.  Placenta was manually delivered and the uterine cavity was clean.  Uterus was closed in 2 running locking imbricating layers of 0 Monocryl suture.  There was a small amount of bleeding at the left angle.  The uterus was exteriorized and an O'leary stitch was placed on the left side under good visualization without  difficulty.  This obtained good hemostasis. There was a 4 cm pedunculated subserosal fibroid on the left anterior fundus as well as a 4 cm subserosal fibroid on the right lower posterior uterine segment.  There was a approximately 4 cm purple deflated soft bit of a fleshy serosa extending from the base of the pedunculated fibroid on the left anterior fundus.  This was biopsied off with the Bovie without difficulty and sent to Pathology.  Good hemostasis was noted.  After inspection and irrigation, the anterior peritoneum was closed in running fashion with 0 Monocryl suture which was also used to reapproximate the  pyramidalis muscle in the midline.  The anterior rectus fascia was then closed in a running fashion with a 0 looped PDS suture.  After assuring good hemostasis, 20 mL of Exparel dissolved in 20 mL saline was then injected both subfascially and subcutaneously. Subcutaneous tissue was reapproximated with a 0 plain suture and the skin was closed with a subcuticular Vicryl on a Keith needle.  Steri- Strips were applied.  All counts were correct.  The patient was taken to the recovery room in stable condition.     Daleen Bo Gaetano Net, M.D.     JET/MEDQ  D:  04/12/2014  T:  04/13/2014  Job:  [id number]

## 2014-04-13 NOTE — Lactation Note (Signed)
This note was copied from the chart of Michaela Bernestine Balsley. Lactation Consultation Note  Patient Name: Michaela Calderon Today's Date: 04/13/2014 Reason for consult: Initial assessment  Visited with Mom, baby at 39 hrs old.  Baby at the breast, in football hold.  More pillow support needed to raise baby up higher, and baby noted to be suckling on the nipple and making a clicking noise.  Repositioned Mom, and added pillows to facilitate a deeper areolar latch.  Basic breast feeding reviewed.  Baby helped to latch deeper and baby noted to have rhythmic sucking.  Encouraged skin to skin, and cue based feedings.  Brochure left with Mom.  Informed of lactation's IP and OP services available to her.  To call her RN for assistance as needed.  Follow up in am.  Consult Status Consult Status: Follow-up Date: 04/14/14 Follow-up type: In-patient    Broadus John 04/13/2014, 2:34 PM

## 2014-04-13 NOTE — Progress Notes (Signed)
Subjective: Postpartum Day 1: Cesarean Delivery Patient reports tolerating PO.    Objective: Vital signs in last 24 hours: Temp:  [97.8 F (36.6 C)-99.9 F (37.7 C)] 99.1 F (37.3 C) (09/15 0605) Pulse Rate:  [66-93] 75 (09/15 0610) Resp:  [16-44] 16 (09/15 0605) BP: (128-160)/(64-86) 133/80 mmHg (09/15 0610) SpO2:  [97 %-100 %] 99 % (09/15 0605) Weight:  [154 lb (69.854 kg)] 154 lb (69.854 kg) (09/14 1853)  Physical Exam:  General: alert and cooperative Lochia: appropriate Uterine Fundus: firm Incision: small drainage noted on bandage , no active bleeding noted DVT Evaluation: No evidence of DVT seen on physical exam. Negative Homan's sign. No cords or calf tenderness. No significant calf/ankle edema.   Recent Labs  04/12/14 1920 04/13/14 0615  HGB 14.3 12.4  HCT 40.8 36.4    Assessment/Plan: Status post Cesarean section. Doing well postoperatively.  Continue current care.  Breella Vanostrand G 04/13/2014, 7:58 AM

## 2014-04-13 NOTE — Progress Notes (Signed)
Unable to stand at this time.

## 2014-04-13 NOTE — Progress Notes (Signed)
Unable to stand at this time. Will try again at next check.

## 2014-04-14 NOTE — Lactation Note (Signed)
This note was copied from the chart of Michaela Calderon. Lactation Consultation Note  Patient Name: Michaela Sahian Kerney [id number] Date: 04/14/2014 Reason for consult: Follow-up assessment  Infant <6lbs and is 46 hrs old.  Mom complaining that the baby "acts hungry all the time."  Infant has breastfed x15 (15-20 min) + 1(5 min) in past 24 hours.  Upon entering room infant was at breast and had a chomping motion at breasts.  LC tugged on chin to untuck lip and infant sucked in a deeper rhythmical motion, swallows heard.  No misshapen nipple noted; mom denies pain.  Infant fussy and crying after feeding for 10+ minutes.  LC assessed infant's mouth with gloved finger and noted a deep posterior frenulum; blanching of short frenulum when LC lifted infant's tongue with fingers; cuping of tongue noted with crying, no lateral movement of tongue from side-to-side with gloved finger, and when sucking on gloved finger, infant was not maintaining seal on finger from decreased elevation from center of tongue.  All of these symptoms of a posterior frenulum will cause decreased milk transfer as exhibited by infant feeding 15 times in past 24 hours and still showing feeding cues after being on breasts.  [place] worked with mom on hand expressing and spoon feeding; 1 ml colostrum spoon fed to infant.  Set up with DEBP and taught mom how to pump on Preemie setting with 3-4 teardrops and using hands-on pumping and hand expression at end of pumping session.  After 10 minutes of pumping colostrum collected was around flanges but unable to collect with a curved-tip syringe.  LC put infant back at breast and infant latched with depth.  Mom concerned about infant being hungry tonight.  Discussed potential need to give formula d/t findings and mom was relieved with back-up plan.  [place] communicated findings with RN and potential need to supplement with formula d/t infant's behavior and feeding assessment findings. LC will follow-up with mom  tomorrow.    Follow-up from chart documentation - at 1715 infant was given 10 ml formula by curved-tip syringe.    Maternal Data Has patient been taught Hand Expression?: Yes  Feeding Feeding Type: Breast Fed Length of feed: 10 min  LATCH Score/Interventions Latch: Grasps breast easily, tongue down, lips flanged, rhythmical sucking. Intervention(s): Assist with latch;Adjust position;Breast compression  Audible Swallowing: A few with stimulation Intervention(s): Hand expression  Type of Nipple: Everted at rest and after stimulation Intervention(s): Double electric pump  Comfort (Breast/Nipple): Soft / non-tender  Problem noted: Mild/Moderate discomfort Interventions (Mild/moderate discomfort): Hand massage  Hold (Positioning): Assistance needed to correctly position infant at breast and maintain latch. (assisted with deeper latch) Intervention(s): Breastfeeding basics reviewed;Support Pillows;Position options  LATCH Score: 8  Lactation Tools Discussed/Used Tools: Pump;Other (comment) (spoon) WIC Program:  (patient has private insurance) Pump Review: Setup, frequency, and cleaning Initiated by:: Gaynelle Adu, RN, MSN, IBCLC Date initiated:: 04/14/14   Consult Status Consult Status: Follow-up Date: 04/15/14 Follow-up type: In-patient    Merlene Laughter 04/14/2014, 3:24 PM

## 2014-04-14 NOTE — Addendum Note (Signed)
Addendum created 04/14/14 1032 by Georgeanne Nim, CRNA   Modules edited: Notes Section   Notes Section:  File: [id number]

## 2014-04-14 NOTE — Anesthesia Postprocedure Evaluation (Signed)
  Anesthesia Post-op Note  Patient: Michaela Calderon  Procedure(s) Performed: Procedure(s): CESAREAN SECTION (N/A)  Patient Location: Mother/Baby  Anesthesia Type:Spinal  Level of Consciousness: awake, alert , oriented and patient cooperative  Airway and Oxygen Therapy: Patient Spontanous Breathing  Post-op Pain: mild  Post-op Assessment: Patient's Cardiovascular Status Stable, Respiratory Function Stable, No signs of Nausea or vomiting, Pain level controlled, No headache, No backache, No residual numbness and No residual motor weakness  Post-op Vital Signs: Reviewed and stable  Last Vitals:  Filed Vitals:   04/14/14 0549  BP: 124/71  Pulse: 70  Temp: 36.8 C  Resp: 16    Complications: No apparent anesthesia complications

## 2014-04-14 NOTE — Progress Notes (Signed)
Subjective: Postpartum Day 2: Cesarean Delivery Patient reports tolerating PO and no problems voiding.    Objective: Vital signs in last 24 hours: Temp:  [98.1 F (36.7 C)-98.9 F (37.2 C)] 98.3 F (36.8 C) (09/16 0549) Pulse Rate:  [70-80] 70 (09/16 0549) Resp:  [16] 16 (09/16 0549) BP: (121-129)/(70-76) 124/71 mmHg (09/16 0549) SpO2:  [97 %-99 %] 99 % (09/15 2000)  Physical Exam:  General: alert and cooperative Lochia: appropriate Uterine Fundus: firm Incision: honeycomb dressing noted with old drainage noted on bandage DVT Evaluation: No evidence of DVT seen on physical exam. Negative Homan's sign. No cords or calf tenderness. No significant calf/ankle edema.   Recent Labs  04/12/14 1920 04/13/14 0615  HGB 14.3 12.4  HCT 40.8 36.4    Assessment/Plan: Status post Cesarean section. Doing well postoperatively.  Continue current care.  Hatsumi Steinhart G 04/14/2014, 7:12 AM

## 2014-04-15 MED ORDER — OXYCODONE-ACETAMINOPHEN 5-325 MG PO TABS: 1.0000 | ORAL_TABLET | ORAL | Status: AC | PRN

## 2014-04-15 MED ORDER — TETANUS-DIPHTH-ACELL PERTUSSIS 5-2.5-18.5 LF-MCG/0.5 IM SUSP
0.5000 mL | Freq: Once | INTRAMUSCULAR | Status: AC
  Administered 2014-04-15: 0.5 mL via INTRAMUSCULAR
  Filled 2014-04-15 (×2): qty 0.5

## 2014-04-15 MED ORDER — IBUPROFEN 600 MG PO TABS: 600.0000 mg | ORAL_TABLET | Freq: Four times a day (QID) | ORAL | Status: AC

## 2014-04-15 NOTE — Discharge Summary (Signed)
Obstetric Discharge Summary Reason for Admission: onset of labor Prenatal Procedures: ultrasound Intrapartum Procedures: cesarean: low cervical, transverse Postpartum Procedures: none Complications-Operative and Postpartum: none Hemoglobin  Date Value Ref Range Status  04/13/2014 12.4  12.0 - 15.0 g/dL Final     HCT  Date Value Ref Range Status  04/13/2014 36.4  36.0 - 46.0 % Final    Physical Exam:  General: alert and cooperative Lochia: appropriate Uterine Fundus: firm Incision: honeycomb dressing CDI DVT Evaluation: No evidence of DVT seen on physical exam. Negative Homan's sign. No cords or calf tenderness. No significant calf/ankle edema.  Discharge Diagnoses: Term Pregnancy-delivered  Discharge Information: Date: 04/15/2014 Activity: pelvic rest Diet: routine Medications: PNV, Ibuprofen and Percocet Condition: stable Instructions: refer to practice specific booklet Discharge to: home   Newborn Data: Live born female  Birth Weight: 5 lb 11.4 oz (2590 g) APGAR: 3, 7  Home with mother.  Jsiah Menta G 04/15/2014, 8:07 AM

## 2014-05-25 ENCOUNTER — Other Ambulatory Visit: Payer: Self-pay | Admitting: Obstetrics and Gynecology

## 2014-05-26 LAB — CYTOLOGY - PAP

## 2014-05-31 ENCOUNTER — Encounter (HOSPITAL_COMMUNITY): Payer: Self-pay | Admitting: Obstetrics and Gynecology

## 2014-12-07 ENCOUNTER — Ambulatory Visit (INDEPENDENT_AMBULATORY_CARE_PROVIDER_SITE_OTHER): Payer: 59 | Admitting: Physician Assistant

## 2014-12-07 VITALS — BP 118/74 | HR 74 | Temp 97.9°F | Resp 16 | Ht 61.0 in | Wt 136.0 lb

## 2014-12-07 DIAGNOSIS — M654 Radial styloid tenosynovitis [de Quervain]: Secondary | ICD-10-CM

## 2014-12-07 DIAGNOSIS — K088 Other specified disorders of teeth and supporting structures: Secondary | ICD-10-CM

## 2014-12-07 DIAGNOSIS — K0889 Other specified disorders of teeth and supporting structures: Secondary | ICD-10-CM

## 2014-12-07 MED ORDER — ACETAMINOPHEN 500 MG PO TABS: 500.0000 mg | ORAL_TABLET | Freq: Four times a day (QID) | ORAL | Status: AC | PRN

## 2014-12-07 NOTE — Progress Notes (Signed)
   Subjective:    Patient ID: Michaela Calderon, female    DOB: 1975/11/05, 39 y.o.   MRN: [id number]  HPI Patient presents for tooth and wrist pain.   Tooth at top back right position has been painful for 2 days and is making head hurt as well. Is unable to sleep well due to pain. Pain is throbbing. Insurance will only pay for one xray in 4 years and is trying to get records sent from previous dentist to current. Appt will be able to go forward once records arrives as she does not want to pay out of pocket for new xrays.   Left wrist pain has been present for 4 months. Pain mostly felt at thumb and up to forearm. Denies swelling, but endorses achy pain being worse at end of day and when lifting her 38 month old daughter. Denies loss of ROM/function/sensation, numbness, weakness, but if has slept on hand feels tingling in thumb only. Has not dropped anything, but has had to put items down first thing in the morning until hand feels better.  NKDA. Is currently breast feeding.    Review of Systems  Constitutional: Negative.   HENT: Positive for dental problem. Negative for congestion, drooling and ear pain.   Musculoskeletal: Positive for arthralgias. Negative for myalgias and joint swelling.  Neurological: Positive for headaches. Negative for dizziness.       Objective:   Physical Exam  Constitutional: She appears well-developed and well-nourished. No distress.  Blood pressure 118/74, pulse 74, temperature 97.9 F (36.6 C), temperature source Oral, resp. rate 16, height 5\' 1"  (1.549 m), weight 136 lb (61.689 kg), SpO2 98 %, currently breastfeeding.  HENT:  Head: Normocephalic and atraumatic.  Right Ear: Tympanic membrane, external ear and ear canal normal.  Left Ear: Tympanic membrane, external ear and ear canal normal.  Mouth/Throat: Oropharynx is clear and moist and mucous membranes are normal. She does not have dentures. No oral lesions. No trismus in the jaw. Abnormal dentition. Dental  caries (several) present. No dental abscesses, uvula swelling or lacerations. No oropharyngeal exudate, posterior oropharyngeal edema or posterior oropharyngeal erythema.  Eyes: Conjunctivae are normal. Pupils are equal, round, and reactive to light. Right eye exhibits no discharge. Left eye exhibits no discharge. No scleral icterus.  Musculoskeletal: Normal range of motion. She exhibits no edema or tenderness.  Skin: She is not diaphoretic.       Assessment & Plan:  1. De Quervain's tenosynovitis Thumb spica with brace to be worn daily. - acetaminophen (TYLENOL) 500 MG tablet; Take 1-2 tablets (500-1,000 mg total) by mouth every 6 (six) hours as needed for mild pain or moderate pain.  Dispense: 30 tablet; Refill: 1  2. Tooth pain Given Dr. Donn Pierini information for emergency dental procedures. - acetaminophen (TYLENOL) 500 MG tablet; Take 1-2 tablets (500-1,000 mg total) by mouth every 6 (six) hours as needed for mild pain or moderate pain.  Dispense: 30 tablet; Refill: [place] PA-C  [facility] 12/07/2014 5:55 PM

## 2014-12-07 NOTE — Patient Instructions (Signed)
Dentist: Dr. Jonna Coup [address] [place], [place] [address]  [email or web address]  [phone]   Harriet Pho Tenosynovitis De Quervain's tenosynovitis involves inflammation of one or two tendon linings (sheaths) or strain of one or two tendons to the thumb: extensor pollicis brevis (EPB), or abductor pollicis longus (APL). This causes pain on the side of the wrist and base of the thumb. Tendon sheaths secrete a fluid that lubricates the tendon, allowing the tendon to move smoothly. When the sheath becomes inflamed, the tendon cannot move freely in the sheath. Both the EPB and APL tendons are important for proper use of the hand. The EPB tendon is important for straightening the thumb. The APL tendon is important for moving the thumb away from the index finger (abducting). The two tendons pass through a small tube (canal) in the wrist, near the base of the thumb. When the tendons become inflamed, pain is usually felt in this area. SYMPTOMS   Pain, tenderness, swelling, warmth, or redness over the base of the thumb and thumb side of the wrist.  Pain that gets worse when straightening the thumb.  Pain that gets worse when moving the thumb away from the index finger, against resistance.  Pain with pinching or gripping.  Locking or catching of the thumb.  Limited motion of the thumb.  Crackling sound (crepitation) when the tendon or thumb is moved or touched.  Fluid-filled cyst in the area of the base of the thumb. CAUSES   Tenosynovitis is often linked with overuse of the wrist.  Tenosynovitis may be caused by repeated injury to the thumb muscle and tendon units, and with repeated motions of the hand and wrist, due to friction of the tendon within the lining (sheath).  Tenosynovitis may also be due to a sudden increase in activity or change in activity. RISK INCREASES WITH:  Sports that involve repeated hand and wrist motions (golf, bowling, tennis, squash, racquetball).  Heavy  labor.  Poor physical wrist strength and flexibility.  Failure to warm up properly before practice or play.  Female gender.  New mothers who hold their baby's head for long periods or lift infants with thumbs in the infant's armpit (axilla). PREVENTION  Warm up and stretch properly before practice or competition.  Allow enough time for rest and recovery between practices and competition.  Maintain appropriate conditioning:  Cardiovascular fitness.  Forearm, wrist, and hand flexibility.  Muscle strength and endurance.  Use proper exercise technique. PROGNOSIS  This condition is usually curable within 6 weeks, if treated properly with non-surgical treatment and resting of the affected area.  RELATED COMPLICATIONS   Longer healing time if not properly treated or if not given enough time to heal.  Chronic inflammation, causing recurring symptoms of tenosynovitis. Permanent pain or restriction of movement.  Risks of surgery: infection, bleeding, injury to nerves (numbness of the thumb), continued pain, incomplete release of the tendon sheath, recurring symptoms, cutting of the tendons, tendons sliding out of position, weakness of the thumb, thumb stiffness. TREATMENT  First, treatment involves the use of medicine and ice, to reduce pain and inflammation. Patients are encouraged to stop or modify activities that aggravate the injury. Stretching and strengthening exercises may be advised. Exercises may be completed at home or with a therapist. You may be fitted with a brace or splint, to limit motion and allow the injury to heal. Your caregiver may also choose to give you a corticosteroid injection, to reduce the pain and inflammation. If non-surgical treatment is not  successful, surgery may be needed. Most tenosynovitis surgeries are done as outpatient procedures (you go home the same day). Surgery may involve local, regional (whole arm), or general anesthesia.  MEDICATION   If pain  medicine is needed, nonsteroidal anti-inflammatory medicines (aspirin and ibuprofen), or other minor pain relievers (acetaminophen), are often advised.  Do not take pain medicine for 7 days before surgery.  Prescription pain relievers are often prescribed only after surgery. Use only as directed and only as much as you need.  Corticosteroid injections may be given if your caregiver thinks they are needed. There is a limited number of times these injections may be given. COLD THERAPY   Cold treatment (icing) should be applied for 10 to 15 minutes every 2 to 3 hours for inflammation and pain, and immediately after activity that aggravates your symptoms. Use ice packs or an ice massage. SEEK MEDICAL CARE IF:   Symptoms get worse or do not improve in 2 to 4 weeks, despite treatment.  You experience pain, numbness, or coldness in the hand.  Blue, gray, or dark color appears in the fingernails.  Any of the following occur after surgery: increased pain, swelling, redness, drainage of fluids, bleeding in the affected area, or signs of infection.  New, unexplained symptoms develop. (Drugs used in treatment may produce side effects.) Document Released: 07/16/2005 Document Revised: 10/08/2011 Document Reviewed: 10/28/2008 [facility] Patient Information 2015 [place], [place]. This information is not intended to replace advice given to you by your health care provider. Make sure you discuss any questions you have with your health care provider.
